# Patient Record
Sex: Male | Born: 1975 | Race: White | Hispanic: No | Marital: Married | Smoking: Current every day smoker
Health system: Southern US, Community
[De-identification: ages and names within clinical notes are randomized; demographics above are authoritative.]

## PROBLEM LIST (undated history)

## (undated) DIAGNOSIS — M549 Dorsalgia, unspecified: Secondary | ICD-10-CM

## (undated) DIAGNOSIS — G7100 Muscular dystrophy, unspecified: Secondary | ICD-10-CM

## (undated) DIAGNOSIS — M21379 Foot drop, unspecified foot: Secondary | ICD-10-CM

## (undated) DIAGNOSIS — G8929 Other chronic pain: Secondary | ICD-10-CM

## (undated) HISTORY — DX: Other chronic pain: G89.29

## (undated) HISTORY — DX: Dorsalgia, unspecified: M54.9

## (undated) HISTORY — DX: Muscular dystrophy, unspecified: G71.00

---

## 2007-10-15 ENCOUNTER — Emergency Department (HOSPITAL_COMMUNITY): Admission: EM | Admit: 2007-10-15 | Discharge: 2007-10-15 | Payer: Self-pay | Admitting: Emergency Medicine

## 2007-10-23 ENCOUNTER — Ambulatory Visit (HOSPITAL_COMMUNITY): Admission: RE | Admit: 2007-10-23 | Discharge: 2007-10-23 | Payer: Self-pay | Admitting: Emergency Medicine

## 2011-09-03 LAB — DIFFERENTIAL
Eosinophils Relative: 1
Lymphocytes Relative: 17
Neutro Abs: 9.8 — ABNORMAL HIGH
Neutrophils Relative %: 76

## 2011-09-03 LAB — CBC
MCHC: 33.9
MCV: 89.6
RBC: 5.24
WBC: 12.9 — ABNORMAL HIGH

## 2011-09-03 LAB — BASIC METABOLIC PANEL
BUN: 13
CO2: 27
Chloride: 102
GFR calc Af Amer: >60

## 2011-09-03 LAB — URINALYSIS, ROUTINE W REFLEX MICROSCOPIC
Glucose, UA: NEGATIVE
Nitrite: NEGATIVE
Urobilinogen, UA: 0.2

## 2011-09-03 LAB — CULTURE, BLOOD (ROUTINE X 2)

## 2012-04-16 ENCOUNTER — Other Ambulatory Visit: Payer: Self-pay | Admitting: Family Medicine

## 2012-04-16 DIAGNOSIS — G8929 Other chronic pain: Secondary | ICD-10-CM

## 2012-04-22 ENCOUNTER — Other Ambulatory Visit: Payer: Self-pay

## 2012-04-24 ENCOUNTER — Inpatient Hospital Stay: Admission: RE | Admit: 2012-04-24 | Payer: Self-pay | Source: Ambulatory Visit

## 2013-06-18 ENCOUNTER — Telehealth: Payer: Self-pay | Admitting: Family Medicine

## 2013-06-23 NOTE — Telephone Encounter (Signed)
APPT MADE

## 2013-07-09 ENCOUNTER — Encounter: Payer: Self-pay | Admitting: Family Medicine

## 2013-07-09 ENCOUNTER — Ambulatory Visit (INDEPENDENT_AMBULATORY_CARE_PROVIDER_SITE_OTHER): Payer: Medicare Other | Admitting: Family Medicine

## 2013-07-09 VITALS — BP 146/82 | HR 67 | Temp 97.8°F | Ht 63.5 in | Wt 122.2 lb

## 2013-07-09 DIAGNOSIS — G7109 Other specified muscular dystrophies: Secondary | ICD-10-CM

## 2013-07-09 DIAGNOSIS — G71039 Limb girdle muscular dystrophy, unspecified: Secondary | ICD-10-CM

## 2013-07-09 DIAGNOSIS — G8929 Other chronic pain: Secondary | ICD-10-CM

## 2013-07-09 DIAGNOSIS — M549 Dorsalgia, unspecified: Secondary | ICD-10-CM

## 2013-07-09 MED ORDER — KETOROLAC TROMETHAMINE 30 MG/ML IJ SOLN
60.0000 mg | Freq: Once | INTRAMUSCULAR | Status: AC
  Administered 2013-07-09: 60 mg via INTRAMUSCULAR

## 2013-07-09 MED ORDER — CELECOXIB 200 MG PO CAPS: 200.0000 mg | ORAL_CAPSULE | Freq: Two times a day (BID) | ORAL | Status: DC

## 2013-07-09 NOTE — Patient Instructions (Signed)
Ketorolac injection What is this medicine? KETOROLAC (kee toe ROLE ak) is a non-steroidal anti-inflammatory drug (NSAID). It is used to treat moderate to severe pain for up to 5 days. It is commonly used after surgery. This medicine should not be used for more than 5 days. This medicine may be used for other purposes; ask your health care provider or pharmacist if you have questions. What should I tell my health care provider before I take this medicine? They need to know if you have any of these conditions: -asthma, especially aspirin-sensitive asthma -bleeding problems -kidney disease -stomach bleed, ulcer, or other problem -taking aspirin, other NSAID, or probenecid -an unusual or allergic reaction to ketorolac, tromethamine, aspirin, other NSAIDs, other medicines, foods, dyes or preservatives -pregnant or trying to get pregnant -breast-feeding How should I use this medicine? This medicine is for injection into a muscle or into a vein. It is given by a health care professional in a hospital or clinic setting. Talk to your pediatrician regarding the use of this medicine in children. While this drug may be prescribed for children as young as 2 years old for selected conditions, precautions do apply. Patients over 65 years old may have a stronger reaction and need a smaller dose. Overdosage: If you think you have taken too much of this medicine contact a poison control center or emergency room at once. NOTE: This medicine is only for you. Do not share this medicine with others. What if I miss a dose? This does not apply. What may interact with this medicine? Do not take this medicine with any of the following medications: -aspirin and aspirin-like medicines -cidofovir -methotrexate -NSAIDs, medicines for pain and inflammation, like ibuprofen or naproxen -pentoxifylline -probenecid This medicine may also interact with the following  medications: -alcohol -alendronate -alprazolam -carbamazepine -diuretics -flavocoxid -fluoxetine -ginkgo -lithium -medicines for blood pressure like enalapril -medicines that affect platelets like pentoxifylline -medicines that treat or prevent blood clots like heparin, warfarin -muscle relaxants -pemetrexed -phenytoin -thiothixene This list may not describe all possible interactions. Give your health care provider a list of all the medicines, herbs, non-prescription drugs, or dietary supplements you use. Also tell them if you smoke, drink alcohol, or use illegal drugs. Some items may interact with your medicine. What should I watch for while using this medicine? Tell your doctor or healthcare professional if your symptoms do not start to get better or if they get worse. This medicine does not prevent heart attack or stroke. In fact, this medicine may increase the chance of a heart attack or stroke. The chance may increase with longer use of this medicine and in people who have heart disease. If you take aspirin to prevent heart attack or stroke, talk with your doctor or health care professional. Do not take medicines such as ibuprofen and naproxen with this medicine. Side effects such as stomach upset, nausea, or ulcers may be more likely to occur. Many medicines available without a prescription should not be taken with this medicine. This medicine can cause ulcers and bleeding in the stomach and intestines at any time during treatment. Do not smoke cigarettes or drink alcohol. These increase irritation to your stomach and can make it more susceptible to damage from this medicine. Ulcers and bleeding can happen without warning symptoms and can cause death. This medicine can cause you to bleed more easily. Try to avoid damage to your teeth and gums when you brush or floss your teeth. What side effects may I notice from receiving   this medicine? Side effects that you should report to your  doctor or health care professional as soon as possible: -allergic reactions like skin rash, itching or hives, swelling of the face, lips, or tongue -black or tarry stools -breathing problems -changes in vision -chest pain -high blood pressure -nausea, vomiting -redness, blistering, peeling or loosening of the skin, including inside the mouth -severe abdominal pain -slurred speech or weakness on one side of the body -trouble passing urine or change in the amount of urine -unexplained weight gain or swelling -unusual bleeding or bruising -unusually weak or tired -yellowing of eyes or skin Side effects that usually do not require medical attention (report to your doctor or health care professional if they continue or are bothersome): -diarrhea -dizziness -headache -heartburn This list may not describe all possible side effects. Call your doctor for medical advice about side effects. You may report side effects to FDA at 1-800-FDA-1088. Where should I keep my medicine? This drug is given in a hospital or clinic and will not be stored at home. NOTE: This sheet is a summary. It may not cover all possible information. If you have questions about this medicine, talk to your doctor, pharmacist, or health care provider.  2013, Elsevier/Gold Standard. (03/31/2008 5:24:50 PM)  

## 2013-07-09 NOTE — Progress Notes (Signed)
Patient ID: Jacob Shepard, male   DOB: November 15, 1976, 37 y.o.   MRN: [id number] SUBJECTIVE: CC: Chief Complaint  Patient presents with  . Medication Problem    discuss pain med management . states he is pt at [facility] and states was seen at [facility] and was on oxycontontin and can not tolerate it.  was seen at [facility] last week . feels like he needs pain med but nothing to strong .    HPI: Came with wife. Attends [employer] because the 5 years has had symptoms that he was finally diagnosed with Muscular Dystrophy. Needs a pain management doctor because his walking produces  Back starins and pains.  Past Medical History  Diagnosis Date  . Muscular dystrophy   . Chronic back pain    No past surgical history on file. History   Social History  . Marital Status: Married    Spouse Name: N/A    Number of Children: N/A  . Years of Education: N/A   Occupational History  . Not on file.   Social History Main Topics  . Smoking status: Current Every Day Smoker -- 0.25 packs/day    Types: Cigarettes  . Smokeless tobacco: Not on file  . Alcohol Use: Not on file  . Drug Use: Not on file  . Sexual Activity: Not on file   Other Topics Concern  . Not on file   Social History Narrative  . No narrative on file   No family history on file. No current outpatient prescriptions on file prior to visit.   No current facility-administered medications on file prior to visit.   No Known Allergies  There is no immunization history on file for this patient. Prior to Admission medications   Medication Sig Start Date End Date Taking? Authorizing Provider  celecoxib (CELEBREX) 200 MG capsule Take 1 capsule (200 mg total) by mouth 2 (two) times daily. 07/09/13   Jacob Ladd, MD     ROS: As above in the HPI. All other systems are stable or negative.  OBJECTIVE: APPEARANCE:  Patient in no acute distress.The patient appeared well nourished and normally developed.  Acyanotic. Waist: VITAL SIGNS:  SKIN: warm and  Dry without overt rashes, tattoos and scars  HEAD and Neck: without JVD, Head and scalp: normal Eyes:No scleral icterus. Fundi normal, eye movements normal. Ears: Auricle normal, canal normal, Tympanic membranes normal, insufflation normal. Nose: normal Throat: normal Neck & thyroid: normal  CHEST & LUNGS: Chest wall: normal Lungs: Clear  CVS: Reveals the PMI to be normally located. Regular rhythm, First and Second Heart sounds are normal,  absence of murmurs, rubs or gallops. Peripheral vasculature: Radial pulses: normal Dorsal pedis pulses: normal Posterior pulses: normal  ABDOMEN:  Appearance: normal Benign, no organomegaly, no masses, no Abdominal Aortic enlargement. No Guarding , no rebound. No Bruits. Bowel sounds: normal  RECTAL: N/A GU: N/A  EXTREMETIES: nonedematous. Both Femoral and Pedal pulses are normal.  NEUROLOGIC: oriented to time,place and person Walks with a waddling wide stepping gait with foot drops .    ASSESSMENT: Muscular dystrophy, limb girdle - Plan: Ambulatory referral to Pain Clinic, EKG 12-Lead, ketorolac (TORADOL) 30 MG/ML injection 60 mg  Chronic back pain - Plan: Ambulatory referral to Pain Clinic, EKG 12-Lead, ketorolac (TORADOL) 30 MG/ML injection 60 mg  PLAN:  Orders Placed This Encounter  Procedures  . Ambulatory referral to Pain Clinic    Referral Priority:  Routine    Referral Type:  Consultation  Referral Reason:  Specialty Services Required    Requested Specialty:  Pain Medicine    Number of Visits Requested:  1  . EKG 12-Lead    Meds ordered this encounter  Medications  . celecoxib (CELEBREX) 200 MG capsule    Sig: Take 1 capsule (200 mg total) by mouth 2 (two) times daily.    Dispense:  30 capsule    Refill:  1  . ketorolac (TORADOL) 30 MG/ML injection 60 mg    Sig:     Results for orders placed during the hospital encounter of 10/15/07  CULTURE, BLOOD  (ROUTINE X 2)      Result Value Range   Specimen Description BLOOD LEFT ARM     Special Requests BOTTLES DRAWN AEROBIC AND ANAEROBIC 10CC EACH     Culture NO GROWTH 5 DAYS     Report Status 10/21/2007 FINAL    CULTURE, BLOOD (ROUTINE X 2)      Result Value Range   Specimen Description BLOOD RIGHT ARM     Special Requests BOTTLES DRAWN AEROBIC AND ANAEROBIC 10CC     Culture NO GROWTH 5 DAYS     Report Status 10/21/2007 FINAL    URINALYSIS, ROUTINE W REFLEX MICROSCOPIC      Result Value Range   Color, Urine YELLOW     APPearance CLEAR     Specific Gravity, Urine 1.007     pH 6.5     Glucose, UA NEGATIVE     Hgb urine dipstick NEGATIVE     Bilirubin Urine NEGATIVE     Ketones, ur NEGATIVE     Protein, ur NEGATIVE     Urobilinogen, UA 0.2     Nitrite NEGATIVE     Leukocytes, UA       Value: NEGATIVE MICROSCOPIC NOT DONE ON URINES WITH NEGATIVE PROTEIN, BLOOD, LEUKOCYTES, NITRITE, OR GLUCOSE <1000 mg/dL.  BASIC METABOLIC PANEL      Result Value Range   Sodium 139     Potassium 3.3 (*)    Chloride 102     CO2 27     Glucose, Bld 83     BUN 13     Creatinine, Ser 0.86     Calcium 10.0     GFR calc non Af Amer >60     GFR calc Af Amer       Value: >60            The eGFR has been calculated     using the MDRD equation.     This calculation has not been     validated in all clinical  CBC      Result Value Range   WBC 12.9 (*)    RBC 5.24     Hemoglobin 15.9     HCT 46.9     MCV 89.6     MCHC 33.9     RDW 13.5     Platelets 355    DIFFERENTIAL      Result Value Range   Neutrophils Relative % 76     Neutro Abs 9.8 (*)    Lymphocytes Relative 17     Lymphs Abs 2.1     Monocytes Relative 7     Monocytes Absolute 0.8     Eosinophils Relative 1     Eosinophils Absolute 0.1 (*)    Basophils Relative 0     Basophils Absolute 0.0    CK      Result Value Range  Total CK 638 (*)    EKG was okay.  Return if symptoms worsen or fail to improve, for Recheck medical  problems.  Rey Fors P. Modesto Charon, M.D.

## 2013-07-09 NOTE — Progress Notes (Signed)
Tolerated toradol injection well without difficulty  

## 2013-07-15 ENCOUNTER — Ambulatory Visit: Payer: Self-pay | Admitting: Family Medicine

## 2013-08-02 ENCOUNTER — Telehealth: Payer: Self-pay | Admitting: *Deleted

## 2013-08-02 NOTE — Telephone Encounter (Signed)
Ins co denied celebrex until two generic nsaids have been tried for at least 14 days each and failed.  She said any generic available nsaid would be covered. Can you take care of this?  Thanks in advance.

## 2013-08-04 ENCOUNTER — Other Ambulatory Visit: Payer: Self-pay | Admitting: Family Medicine

## 2013-08-04 MED ORDER — MELOXICAM 15 MG PO TABS: 15.0000 mg | ORAL_TABLET | Freq: Every day | ORAL | Status: DC

## 2013-08-04 NOTE — Telephone Encounter (Signed)
Prescription ordered in [facility] for meloxicam since celebrex was not.

## 2014-03-24 LAB — PULMONARY FUNCTION TEST

## 2014-05-01 ENCOUNTER — Encounter (HOSPITAL_COMMUNITY): Payer: Self-pay | Admitting: Emergency Medicine

## 2014-05-01 ENCOUNTER — Emergency Department (HOSPITAL_COMMUNITY)
Admission: EM | Admit: 2014-05-01 | Discharge: 2014-05-01 | Disposition: A | Discharge status: Home / Self Care | Payer: PRIVATE HEALTH INSURANCE | Attending: Emergency Medicine | Admitting: Emergency Medicine

## 2014-05-01 ENCOUNTER — Emergency Department (HOSPITAL_COMMUNITY): Payer: PRIVATE HEALTH INSURANCE

## 2014-05-01 DIAGNOSIS — Z8739 Personal history of other diseases of the musculoskeletal system and connective tissue: Secondary | ICD-10-CM | POA: Diagnosis not present

## 2014-05-01 DIAGNOSIS — R296 Repeated falls: Secondary | ICD-10-CM | POA: Insufficient documentation

## 2014-05-01 DIAGNOSIS — F17200 Nicotine dependence, unspecified, uncomplicated: Secondary | ICD-10-CM | POA: Diagnosis not present

## 2014-05-01 DIAGNOSIS — Y9389 Activity, other specified: Secondary | ICD-10-CM | POA: Diagnosis not present

## 2014-05-01 DIAGNOSIS — S82002A Unspecified fracture of left patella, initial encounter for closed fracture: Secondary | ICD-10-CM

## 2014-05-01 DIAGNOSIS — Y929 Unspecified place or not applicable: Secondary | ICD-10-CM | POA: Diagnosis not present

## 2014-05-01 DIAGNOSIS — S99919A Unspecified injury of unspecified ankle, initial encounter: Secondary | ICD-10-CM | POA: Diagnosis present

## 2014-05-01 DIAGNOSIS — G8929 Other chronic pain: Secondary | ICD-10-CM | POA: Diagnosis not present

## 2014-05-01 DIAGNOSIS — S8990XA Unspecified injury of unspecified lower leg, initial encounter: Secondary | ICD-10-CM | POA: Diagnosis present

## 2014-05-01 DIAGNOSIS — S82009A Unspecified fracture of unspecified patella, initial encounter for closed fracture: Secondary | ICD-10-CM | POA: Insufficient documentation

## 2014-05-01 HISTORY — DX: Foot drop, unspecified foot: M21.379

## 2014-05-01 MED ORDER — ONDANSETRON 4 MG PO TBDP
4.0000 mg | ORAL_TABLET | Freq: Once | ORAL | Status: AC
  Administered 2014-05-01: 4 mg via ORAL
  Filled 2014-05-01: qty 1

## 2014-05-01 MED ORDER — MORPHINE SULFATE 4 MG/ML IJ SOLN
INTRAMUSCULAR | Status: AC
  Filled 2014-05-01: qty 1

## 2014-05-01 MED ORDER — NAPROXEN 375 MG PO TABS: 375.0000 mg | ORAL_TABLET | Freq: Two times a day (BID) | ORAL | Status: DC

## 2014-05-01 MED ORDER — MORPHINE SULFATE 4 MG/ML IJ SOLN
4.0000 mg | Freq: Once | INTRAMUSCULAR | Status: AC
  Administered 2014-05-01: 4 mg via INTRAMUSCULAR
  Filled 2014-05-01: qty 1

## 2014-05-01 MED ORDER — OXYCODONE-ACETAMINOPHEN 5-325 MG PO TABS
1.0000 | ORAL_TABLET | Freq: Once | ORAL | Status: AC
Start: 2014-05-01 — End: 2014-05-01
  Administered 2014-05-01: 1 via ORAL
  Filled 2014-05-01: qty 1

## 2014-05-01 MED ORDER — MORPHINE SULFATE 4 MG/ML IJ SOLN
4.0000 mg | Freq: Once | INTRAMUSCULAR | Status: AC
  Administered 2014-05-01: 4 mg via INTRAMUSCULAR

## 2014-05-01 MED ORDER — OXYCODONE-ACETAMINOPHEN 5-325 MG PO TABS: ORAL_TABLET | ORAL | Status: DC

## 2014-05-01 NOTE — ED Notes (Signed)
Tripped over dog today.  C/o pain to left knee, unable to bend left knee.  Obvious swelling noted.

## 2014-05-01 NOTE — ED Notes (Signed)
Ice applied to left knee, pt and family updated on plan of care,

## 2014-05-01 NOTE — ED Provider Notes (Signed)
CSN: [id number]     Arrival date & time 05/01/14  1259 History  This chart was scribed for Ward Givens, MD by Chestine Spore, ED Scribe. The patient was seen in room APA07/APA07 at 2:19 PM.   Chief Complaint  Patient presents with  . Knee Injury   The history is provided by the patient. No language interpreter was used.   HPI Comments: Jacob Shepard is a 38 y.o. male with a h/o of muscle dystrophy who presents to the Emergency Department complaining of left knee injury that occurred at 8:20 AM today. Pt states that he started to fall but he had a cat food bowl in his hands and landed on his left knee and he couldn't catch himself. Pt states that when he fell, he fell directly onto his left knee and he felt pain in his knee. Pt states that when he first got up he didn't notice any changes in his knee and was able to walk about 15 feet without difficulty. Pt states that he is currently unable to walk without pain. He has been using ice and heat on his knee since he fell. Pt states that he is unable to bend or straighten his knee which prevents him from walking.  He also reports associated increased  swelling to the knee.  Pt iced and used a heat compress on the knee cap while sitting without any relief.  Pt states that he falls 3-4 times a week. Pt states that he takes Prilosec for his GERD.    [facility]   Past Medical History  Diagnosis Date  . Muscular dystrophy   . Chronic back pain   . Foot drop    History reviewed. No pertinent past surgical history. No family history on file. History  Substance Use Topics  . Smoking status: Current Every Day Smoker -- 0.25 packs/day    Types: Cigarettes  . Smokeless tobacco: Not on file  . Alcohol Use: Yes     Comment: occ   Lives at home Lives with spouse   Review of Systems  Musculoskeletal: Positive for arthralgias (left knee) and joint swelling (left knee).       Swelling over the left knee.  Skin:       Knot on right  lower leg.  All other systems reviewed and are negative.    Allergies  Review of patient's allergies indicates no known allergies.  Home Medications   Prior to Admission medications   Medication Sig Start Date End Date Taking? Authorizing Provider  prilosec   BP 178/119  Pulse 89  Temp(Src) 99.2 F (37.3 C) (Oral)  Resp 20  SpO2 100%  Vital signs normal except for hypertension consistent with his pain   Physical Exam  Nursing note and vitals reviewed. Constitutional: He is oriented to person, place, and time. He appears well-developed and well-nourished.  Non-toxic appearance. He does not appear ill. No distress.  HENT:  Head: Normocephalic and atraumatic.  Right Ear: External ear normal.  Left Ear: External ear normal.  Nose: Nose normal. No mucosal edema or rhinorrhea.  Mouth/Throat: Oropharynx is clear and moist and mucous membranes are normal. No dental abscesses or uvula swelling.  Eyes: Conjunctivae and EOM are normal. Pupils are equal, round, and reactive to light.  Neck: Normal range of motion and full passive range of motion without pain. Neck supple.  Cardiovascular: Normal rate, regular rhythm and normal heart sounds.  Exam reveals no gallop and no friction rub.  No murmur heard. Pulmonary/Chest: Effort normal and breath sounds normal. No respiratory distress. He has no wheezes. He has no rhonchi. He has no rales. He exhibits no tenderness and no crepitus.  Abdominal: Soft. Normal appearance and bowel sounds are normal. He exhibits no distension. There is no tenderness. There is no rebound and no guarding.  Musculoskeletal: Normal range of motion. He exhibits tenderness. He exhibits no edema.  Diffused swelling and moderate effusion is present on the left knee. Unable to SLR. Distally non tender ankle and foot. Good pulses.  Neurological: He is alert and oriented to person, place, and time. He has normal strength. No cranial nerve deficit.  Skin: Skin is warm,  dry and intact. No rash noted. No erythema. No pallor.  Small subcutaneous nodule on the distal right leg medially, superior to the right medial malleolus, non-tender.   Psychiatric: He has a normal mood and affect. His speech is normal and behavior is normal. His mood appears not anxious.    ED Course  Procedures (including critical care time)  Medications  morphine 4 MG/ML injection 4 mg (4 mg Intramuscular Given 05/01/14 1440)  ondansetron (ZOFRAN-ODT) disintegrating tablet 4 mg (4 mg Oral Given 05/01/14 1442)  morphine 4 MG/ML injection 4 mg (4 mg Intramuscular Given 05/01/14 1514)  oxyCODONE-acetaminophen (PERCOCET/ROXICET) 5-325 MG per tablet 1 tablet (1 tablet Oral Given 05/01/14 1551)    COORDINATION OF CARE: 2:28 PM-Discussed treatment plan of a CT scan, medication, and knee immobilizer with pt at bedside and pt agreed to plan.  Patient given option to take IM pain medicine or oral medication.  He opted for IM pain medicine.  Pt shown his CT results.  Discussed need to follow up with an orthopedist.  Pt placed in a posterior long leg splint. His wife states he has crutches at home.    Ct Knee Left Wo Contrast  05/01/2014   CLINICAL DATA:  Status post fall.  Large joint effusion.  EXAM: CT OF THE LEFT KNEE KNEE WITHOUT CONTRAST  TECHNIQUE: Multidetector CT imaging of the LEFT KNEE knee was performed according to the standard protocol. Multiplanar CT image reconstructions were also generated.  COMPARISON:  Plain films left knee earlier this same day and 12/18/2013.  FINDINGS: The patient has a nondisplaced longitudinal fracture through the medial patellar facet. No other fracture is identified. There is a large lipohemarthrosis. As visualized by CT scan, the menisci and cruciate and collateral ligaments appear intact.  IMPRESSION: Large lipohemarthrosis secondary to a nondisplaced longitudinal fracture of the medial patellar facet.   Electronically Signed   By: Drusilla Kanner M.D.   On:  05/01/2014 15:24    Dg Knee Complete 4 Views Left  05/01/2014   CLINICAL DATA:  Pain and swelling post trauma  EXAM: LEFT KNEE - COMPLETE 4+ VIEW  COMPARISON:  December 18, 2013  FINDINGS: Frontal, lateral, and bilateral oblique views were obtained. There is a large joint effusion. There is soft tissue swelling in this area is well. There is no acute fracture or dislocation. Joint spaces appear intact. No erosive change.  IMPRESSION: Large joint effusion with soft tissue swelling. No apparent fracture.   Electronically Signed   By: Bretta Bang M.D.   On: 05/01/2014 14:07      MDM   Final diagnoses:  Left patella fracture    Discharge Medication List as of 05/01/2014  3:55 PM    START taking these medications   Details  naproxen (NAPROSYN) 375 MG tablet Take 1  tablet (375 mg total) by mouth 2 (two) times daily with a meal., Starting 05/01/2014, Until Discontinued, Print    oxyCODONE-acetaminophen (PERCOCET/ROXICET) 5-325 MG per tablet Take 1 or 2 po Q 6hrs for pain, Print        Plan discharge  Devoria AlbeIva Minette Manders, MD, FACEP    I personally performed the services described in this documentation, which was scribed in my presence. The recorded information has been reviewed and considered.  Devoria AlbeIva Ripley Bogosian, MD, Armando GangFACEP    Ward GivensIva L Tyechia Allmendinger, MD 05/01/14 2115

## 2014-05-01 NOTE — ED Notes (Signed)
Dr Knapp at bedside,  

## 2014-05-01 NOTE — Discharge Instructions (Signed)
You have a fracture of your knee cap of your left knee. You need to see an orthopedist this week to decide if you need surgery or not. You might consider seeing an orthopedist at [facility] so they can coordinate your care with your other doctors there. Elevate your leg. Continue the ice packs, stop the heat for now. Use your crutches and don't put weight on the splint.  Take the oxycodone for pain. You can also take the naproxen with the oxycodone for pain.

## 2014-05-01 NOTE — ED Notes (Signed)
Pt called out to nursing desk, advised that the pain medication has not helped, Dr Lynelle Doctor notified, additional orders given

## 2014-05-01 NOTE — ED Notes (Addendum)
Pt states that he has Muscular dystrophy and has weakness in extremities, was talking cat food to the cats today when his legs became weak, causing him to fall, denies tripping, pt became very agitated when I asked him about tripping, pt c/o pain to left knee,  cms intact distal, denies any other injury, states that he has been unable to bear weight on left knee since falling this am, comfort measure provided,

## 2014-05-01 NOTE — ED Notes (Signed)
Pt states that he has crutches as home and does not need a pair from er, Pt instructed not to put any weight on splint, pt and family member at bedside expressed understanding,

## 2015-07-27 ENCOUNTER — Encounter (HOSPITAL_COMMUNITY): Payer: Self-pay | Admitting: *Deleted

## 2015-07-27 ENCOUNTER — Emergency Department (HOSPITAL_COMMUNITY): Payer: PRIVATE HEALTH INSURANCE

## 2015-07-27 ENCOUNTER — Emergency Department (HOSPITAL_COMMUNITY)
Admission: EM | Admit: 2015-07-27 | Discharge: 2015-07-27 | Disposition: A | Discharge status: Home / Self Care | Payer: PRIVATE HEALTH INSURANCE | Attending: Emergency Medicine | Admitting: Emergency Medicine

## 2015-07-27 DIAGNOSIS — Z720 Tobacco use: Secondary | ICD-10-CM | POA: Diagnosis not present

## 2015-07-27 DIAGNOSIS — S299XXA Unspecified injury of thorax, initial encounter: Secondary | ICD-10-CM | POA: Diagnosis not present

## 2015-07-27 DIAGNOSIS — S0990XA Unspecified injury of head, initial encounter: Secondary | ICD-10-CM | POA: Diagnosis present

## 2015-07-27 DIAGNOSIS — Y9389 Activity, other specified: Secondary | ICD-10-CM | POA: Diagnosis not present

## 2015-07-27 DIAGNOSIS — Y92002 Bathroom of unspecified non-institutional (private) residence single-family (private) house as the place of occurrence of the external cause: Secondary | ICD-10-CM | POA: Insufficient documentation

## 2015-07-27 DIAGNOSIS — S0083XA Contusion of other part of head, initial encounter: Secondary | ICD-10-CM | POA: Diagnosis not present

## 2015-07-27 DIAGNOSIS — Y998 Other external cause status: Secondary | ICD-10-CM | POA: Diagnosis not present

## 2015-07-27 DIAGNOSIS — G8929 Other chronic pain: Secondary | ICD-10-CM | POA: Insufficient documentation

## 2015-07-27 DIAGNOSIS — Z8739 Personal history of other diseases of the musculoskeletal system and connective tissue: Secondary | ICD-10-CM | POA: Insufficient documentation

## 2015-07-27 DIAGNOSIS — W010XXA Fall on same level from slipping, tripping and stumbling without subsequent striking against object, initial encounter: Secondary | ICD-10-CM | POA: Diagnosis not present

## 2015-07-27 DIAGNOSIS — T07XXXA Unspecified multiple injuries, initial encounter: Secondary | ICD-10-CM

## 2015-07-27 MED ORDER — HYDROMORPHONE HCL 2 MG/ML IJ SOLN
2.0000 mg | Freq: Once | INTRAMUSCULAR | Status: AC
  Administered 2015-07-27: 2 mg via INTRAMUSCULAR
  Filled 2015-07-27: qty 1

## 2015-07-27 MED ORDER — OXYCODONE-ACETAMINOPHEN 5-325 MG PO TABS
1.0000 | ORAL_TABLET | ORAL | Status: AC | PRN
Start: 2015-07-27 — End: ?

## 2015-07-27 MED ORDER — ONDANSETRON 8 MG PO TBDP
8.0000 mg | ORAL_TABLET | Freq: Once | ORAL | Status: AC
  Administered 2015-07-27: 8 mg via ORAL
  Filled 2015-07-27: qty 1

## 2015-07-27 NOTE — Discharge Instructions (Signed)
Contusion °A contusion is a deep bruise. Contusions are the result of an injury that caused bleeding under the skin. The contusion may turn blue, purple, or yellow. Minor injuries will give you a painless contusion, but more severe contusions may stay painful and swollen for a few weeks.  °CAUSES  °A contusion is usually caused by a blow, trauma, or direct force to an area of the body. °SYMPTOMS  °· Swelling and redness of the injured area. °· Bruising of the injured area. °· Tenderness and soreness of the injured area. °· Pain. °DIAGNOSIS  °The diagnosis can be made by taking a history and physical exam. An X-ray, CT scan, or MRI may be needed to determine if there were any associated injuries, such as fractures. °TREATMENT  °Specific treatment will depend on what area of the body was injured. In general, the best treatment for a contusion is resting, icing, elevating, and applying cold compresses to the injured area. Over-the-counter medicines may also be recommended for pain control. Ask your caregiver what the best treatment is for your contusion. °HOME CARE INSTRUCTIONS  °· Put ice on the injured area. °¨ Put ice in a plastic bag. °¨ Place a towel between your skin and the bag. °¨ Leave the ice on for 15-20 minutes, 3-4 times a day, or as directed by your health care provider. °· Only take over-the-counter or prescription medicines for pain, discomfort, or fever as directed by your caregiver. Your caregiver may recommend avoiding anti-inflammatory medicines (aspirin, ibuprofen, and naproxen) for 48 hours because these medicines may increase bruising. °· Rest the injured area. °· If possible, elevate the injured area to reduce swelling. °SEEK IMMEDIATE MEDICAL CARE IF:  °· You have increased bruising or swelling. °· You have pain that is getting worse. °· Your swelling or pain is not relieved with medicines. °MAKE SURE YOU:  °· Understand these instructions. °· Will watch your condition. °· Will get help right  away if you are not doing well or get worse. °Document Released: 08/21/2005 Document Revised: 11/16/2013 Document Reviewed: 09/16/2011 °Exit[employer]® Patient Information ©2015 [employer]. This information is not intended to replace advice given to you by your health care provider. Make sure you discuss any questions you have with your health care provider. ° °

## 2015-07-27 NOTE — ED Notes (Signed)
Pt has hx of MD and falls a lot, per wife. Pt fell an hour PTA and felt a pop. Pain to right hip, leg and ribs.

## 2015-07-27 NOTE — ED Provider Notes (Signed)
CSN: [id number]     Arrival date & time 07/27/15  1315 History   First MD Initiated Contact with Patient 07/27/15 1325     Chief Complaint  Patient presents with  . Fall     (Consider location/radiation/quality/duration/timing/severity/associated sxs/prior Treatment) HPI   Jacob Shepard is a 39 y.o. male who presents for evaluation of injuries from fall. He was standing up from the bathroom, tying his shorts, then turned, and slipped on a towel. This caused him to fall, injuring the right chest head and right lower back. 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 Since the fall. He has noticed some tingling in his left leg. He was able to get up and walk with help, after his wife arrived. There is no suggestion for loss of consciousness. He apparently was on the floor for about 30 minutes before his wife arrived. He denies shortness of breath, nausea, vomiting, blurred vision or pain in his arms. He has mild headache and neck pain. There are no other known modifying factors.   Past Medical History  Diagnosis Date  . Muscular dystrophy   . Chronic back pain   . Foot drop    History reviewed. No pertinent past surgical history. No family history on file. Social History  Substance Use Topics  . Smoking status: Current Every Day Smoker -- 0.25 packs/day    Types: Cigarettes  . Smokeless tobacco: None  . Alcohol Use: Yes     Comment: occ    Review of Systems  All other systems reviewed and are negative.     Allergies  Review of patient's allergies indicates no known allergies.  Home Medications   Prior to Admission medications   Medication Sig Start Date End Date Taking? Authorizing Provider  DiphenhydrAMINE HCl, Sleep, (ZZZQUIL) 25 MG CAPS Take 2-4 tablets by mouth at bedtime.   Yes Historical Provider, MD  oxyCODONE-acetaminophen (PERCOCET) 5-325 MG per tablet Take 1 tablet by mouth every 4 (four) hours  as needed for severe pain. 07/27/15   Jacob Bale, MD   BP 138/90 mmHg  Pulse 73  Temp(Src) 98.2 F (36.8 C) (Oral)  Resp 16  Ht  (1.549 m)  Wt 121 lb (54.885 kg)  BMI 22.87 kg/m2  SpO2 100% Physical Exam  Constitutional: He is oriented to person, place, and time. He appears well-developed and well-nourished.  HENT:  Head: Normocephalic and atraumatic.  Right Ear: External ear normal.  Left Ear: External ear normal.  3 small contusions right forehead. No associated abrasion or laceration.  Eyes: Conjunctivae and EOM are normal. Pupils are equal, round, and reactive to light.  Neck: Normal range of motion and phonation normal. Neck supple.  Cardiovascular: Normal rate, regular rhythm and normal heart sounds.   Pulmonary/Chest: Effort normal and breath sounds normal. He exhibits no bony tenderness.  Abdominal: Soft. There is no tenderness.  Musculoskeletal: Normal range of motion.  Mild right lower posterior chest wall tenderness without crepitation. Moderate right posterior pelvic tenderness without crepitation or deformity. No midline tenderness of the thoracic or lumbar spines. Neck is without local tenderness or deformity.  Neurological: He is alert and oriented to person, place, and time. No cranial nerve deficit or sensory deficit. He exhibits normal muscle tone. Coordination normal.  Skin: Skin is warm, dry and intact.  Psychiatric: He has a normal mood and affect. His behavior is normal. Judgment and thought content normal.  Nursing note and vitals reviewed.  ED Course  Procedures (including critical care time)  Medications  HYDROmorphone (DILAUDID) injection 2 mg (2 mg Intramuscular Given 07/27/15 1348)  ondansetron (ZOFRAN-ODT) disintegrating tablet 8 mg (8 mg Oral Given 07/27/15 1348)    Patient Vitals for the past 24 hrs:  BP Temp Temp src Pulse Resp SpO2 Height Weight  07/27/15 1532 138/90 mmHg - - 73 16 100 % - -  07/27/15 1319 107/89 mmHg 98.2 F (36.8 C)  Oral 77 20 100 % 5\' 1"  (1.549 m) 121 lb (54.885 kg)    3:44 PM Reevaluation with update and discussion. After initial assessment and treatment, an updated evaluation reveals he states he is feeling better after the pain medication. He has no additional complaints. Findings discussed with patient and wife, all questions were answered. Jacob Shepard L    Labs Review Labs Reviewed - No data to display  Imaging Review Dg Ribs Unilateral W/chest Right  07/27/2015   CLINICAL DATA:  Status post slip and fall in the bathroom today with a blow to the right side of the chest. Pain. Initial encounter.  EXAM: RIGHT RIBS AND CHEST - 3+ VIEW  COMPARISON:  None.  FINDINGS: No rib fracture is identified. Surrounding osseous structures also appear normal. The right lung is well expanded and clear.  IMPRESSION: Negative exam.   Electronically Signed   By: Drusilla Kanner M.D.   On: 07/27/2015 14:27   Dg Pelvis 1-2 Views  07/27/2015   CLINICAL DATA:  Right pelvic popping sensation after falling in the bathroom striking the right side on a door frame.  EXAM: PELVIS - 1-2 VIEW  COMPARISON:  None.  FINDINGS: There is no evidence of pelvic fracture or diastasis. No pelvic bone lesions are seen.  IMPRESSION: Negative.   Electronically Signed   By: Gaylyn Rong M.D.   On: 07/27/2015 14:26   Ct Head Wo Contrast  07/27/2015   CLINICAL DATA:  Current history of muscular dystrophy resulting in multiple falls. Patient fell earlier today approximately 1 hour prior to arrival in the emergency department and heard an audible pop.  EXAM: CT HEAD WITHOUT CONTRAST  CT CERVICAL SPINE WITHOUT CONTRAST  TECHNIQUE: Multidetector CT imaging of the head and cervical spine was performed following the standard protocol without intravenous contrast. Multiplanar CT image reconstructions of the cervical spine were also generated.  COMPARISON:  None.  FINDINGS: CT HEAD FINDINGS  Ventricular system normal in size and appearance for age. No  mass lesion. No midline shift. No acute hemorrhage or hematoma. No extra-axial fluid collections. No evidence of acute infarction. No focal brain parenchymal abnormalities.  No skull fractures or other focal osseous abnormalities involving the skull. Visualized paranasal sinuses, bilateral mastoid air cells, and bilateral middle ear cavities well-aerated.  CT CERVICAL SPINE FINDINGS  Patient motion blurred images of the upper cervical spine but these were repeated and a diagnostic study was obtained.  No fractures identified involving the cervical spine. Sagittal reconstructed images demonstrate anatomic posterior alignment. No evidence of spinal stenosis. Neural foramina widely patent throughout. Facet joints intact throughout. Disc spaces well preserved, and no significant disc protrusion identified on the soft tissue windows. Coronal reformatted images demonstrate an intact craniocervical junction, intact dens and intact lateral masses throughout.  IMPRESSION: 1. Normal unenhanced head CT. 2. Normal CT of the cervical spine.   Electronically Signed   By: Hulan Saas M.D.   On: 07/27/2015 15:29   Ct Cervical Spine Wo Contrast  07/27/2015   CLINICAL DATA:  Current history  of muscular dystrophy resulting in multiple falls. Patient fell earlier today approximately 1 hour prior to arrival in the emergency department and heard an audible pop.  EXAM: CT HEAD WITHOUT CONTRAST  CT CERVICAL SPINE WITHOUT CONTRAST  TECHNIQUE: Multidetector CT imaging of the head and cervical spine was performed following the standard protocol without intravenous contrast. Multiplanar CT image reconstructions of the cervical spine were also generated.  COMPARISON:  None.  FINDINGS: CT HEAD FINDINGS  Ventricular system normal in size and appearance for age. No mass lesion. No midline shift. No acute hemorrhage or hematoma. No extra-axial fluid collections. No evidence of acute infarction. No focal brain parenchymal abnormalities.  No  skull fractures or other focal osseous abnormalities involving the skull. Visualized paranasal sinuses, bilateral mastoid air cells, and bilateral middle ear cavities well-aerated.  CT CERVICAL SPINE FINDINGS  Patient motion blurred images of the upper cervical spine but these were repeated and a diagnostic study was obtained.  No fractures identified involving the cervical spine. Sagittal reconstructed images demonstrate anatomic posterior alignment. No evidence of spinal stenosis. Neural foramina widely patent throughout. Facet joints intact throughout. Disc spaces well preserved, and no significant disc protrusion identified on the soft tissue windows. Coronal reformatted images demonstrate an intact craniocervical junction, intact dens and intact lateral masses throughout.  IMPRESSION: 1. Normal unenhanced head CT. 2. Normal CT of the cervical spine.   Electronically Signed   By: Hulan Saas M.D.   On: 07/27/2015 15:29   I have personally reviewed and evaluated these images and lab results as part of my medical decision-making.   EKG Interpretation None      MDM   Final diagnoses:  Contusion, multiple sites    Fall related to weakness and balance problems associated with chronic disease. No evidence for fracture or serious injury.  Nursing Notes Reviewed/ Care Coordinated Applicable Imaging Reviewed Interpretation of Laboratory Data incorporated into ED treatment  The patient appears reasonably screened and/or stabilized for discharge and I doubt any other medical condition or other [facility] requiring further screening, evaluation, or treatment in the ED at this time prior to discharge.  Plan: Home Medications- Percocet; Home Treatments- rest; return here if the recommended treatment, does not improve the symptoms; Recommended follow up- PCP when necessary     Jacob Bale, MD 07/27/15 1545

## 2015-07-27 NOTE — ED Notes (Signed)
Pt remains in xray.

## 2015-07-27 NOTE — ED Notes (Signed)
Pt and wife given discharge instructions -- verbalized understanding, No co voiced - Pt wheeled off unit

## 2015-07-27 NOTE — ED Notes (Signed)
Pt back too room at this time

## 2015-08-26 DEATH — deceased

## 2016-03-11 IMAGING — DX DG RIBS W/ CHEST 3+V*R*
4 series · 4 of 4 positions shown · non-contrast
Comparison: None.

CLINICAL DATA: Status post slip and fall in the bathroom today with
a blow to the right side of the chest. Pain. Initial encounter.

EXAM:
RIGHT RIBS AND CHEST - 3+ VIEW

[chest pa]
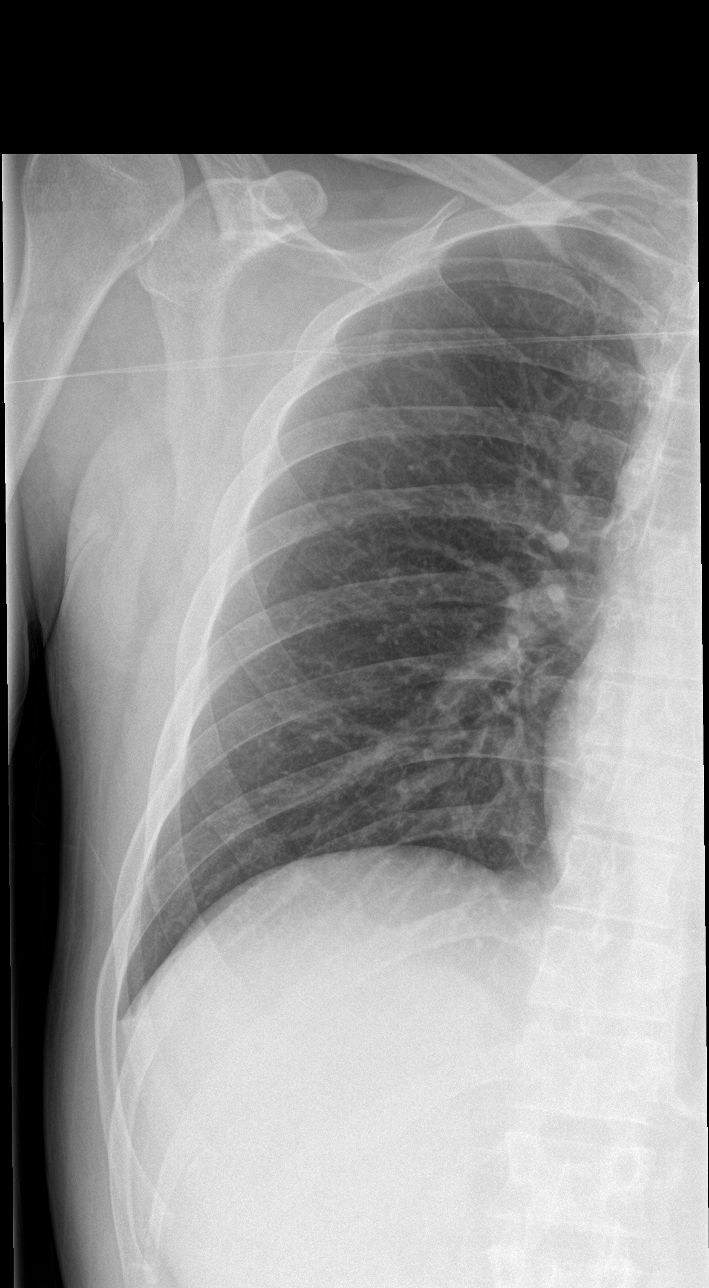

[rib pa obl (1 of 2)]
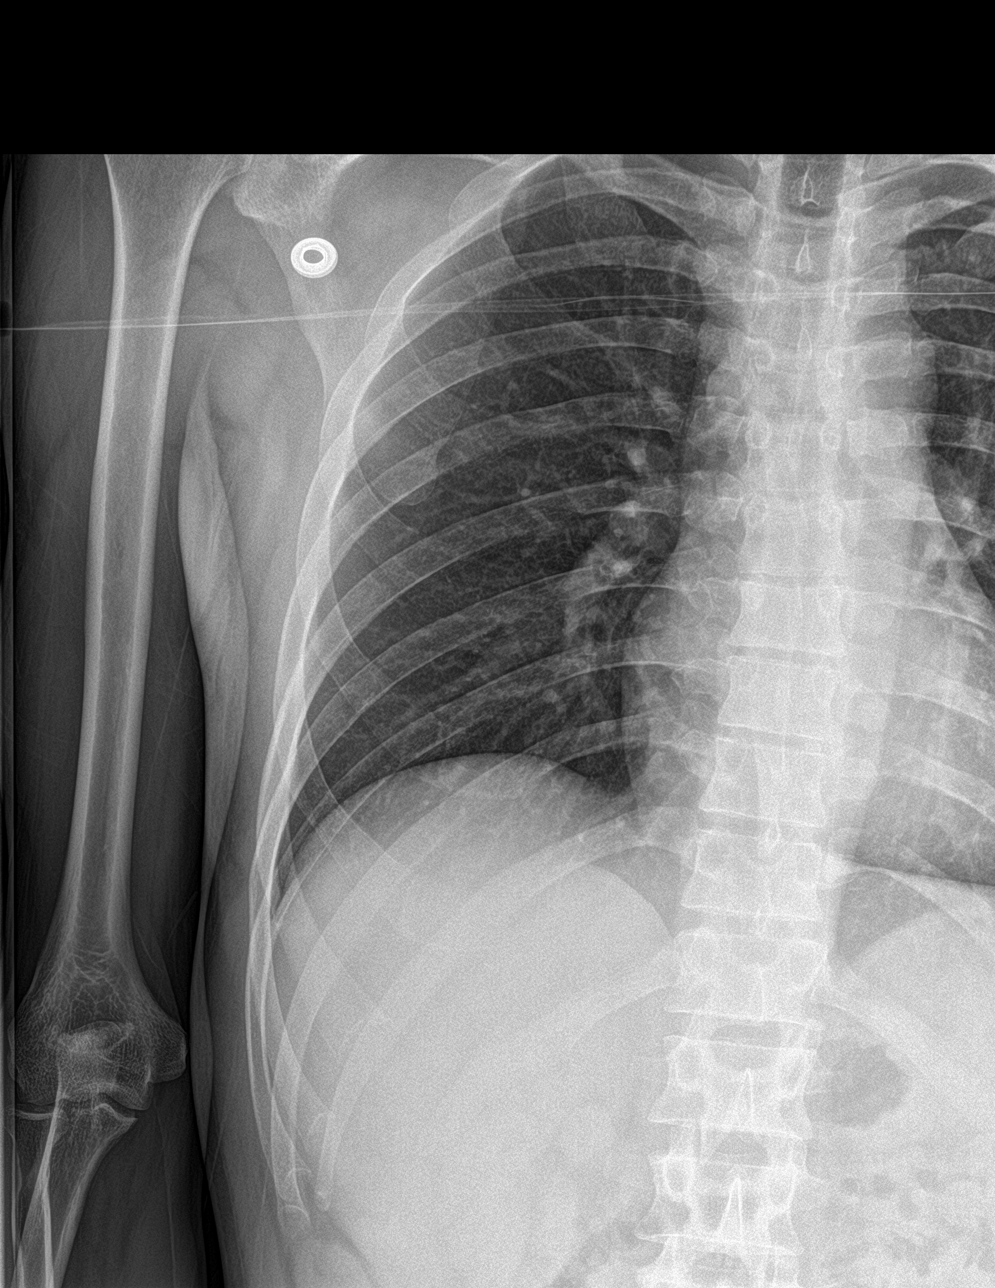

[rib pa obl (2 of 2)]
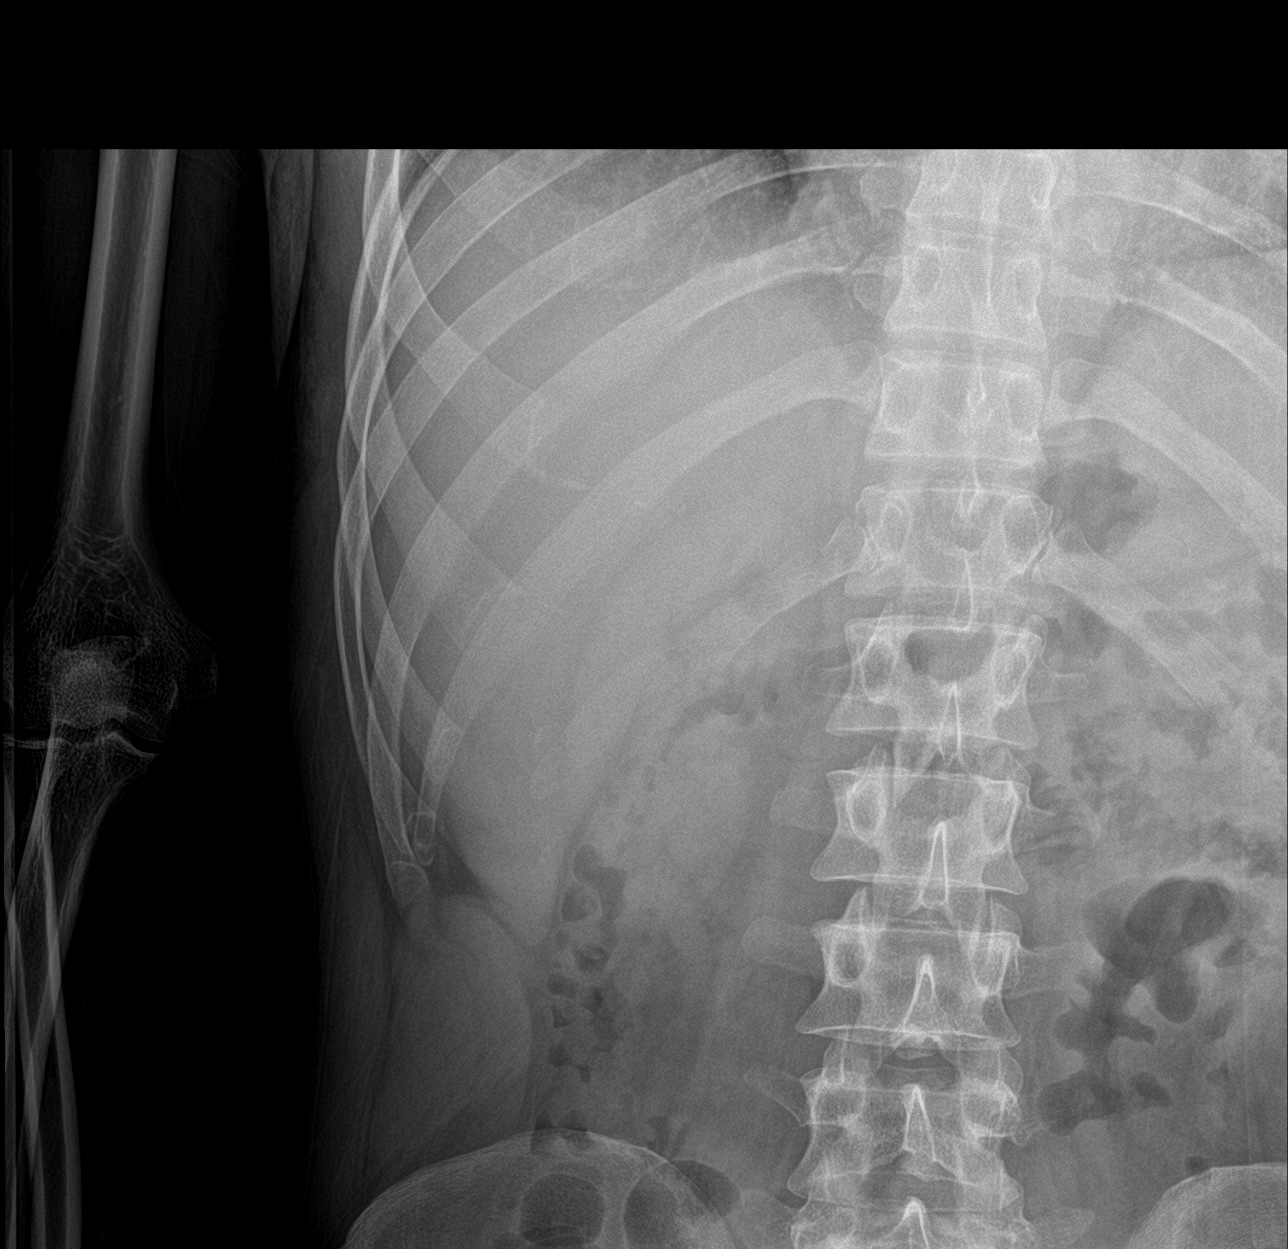

[chest ap]
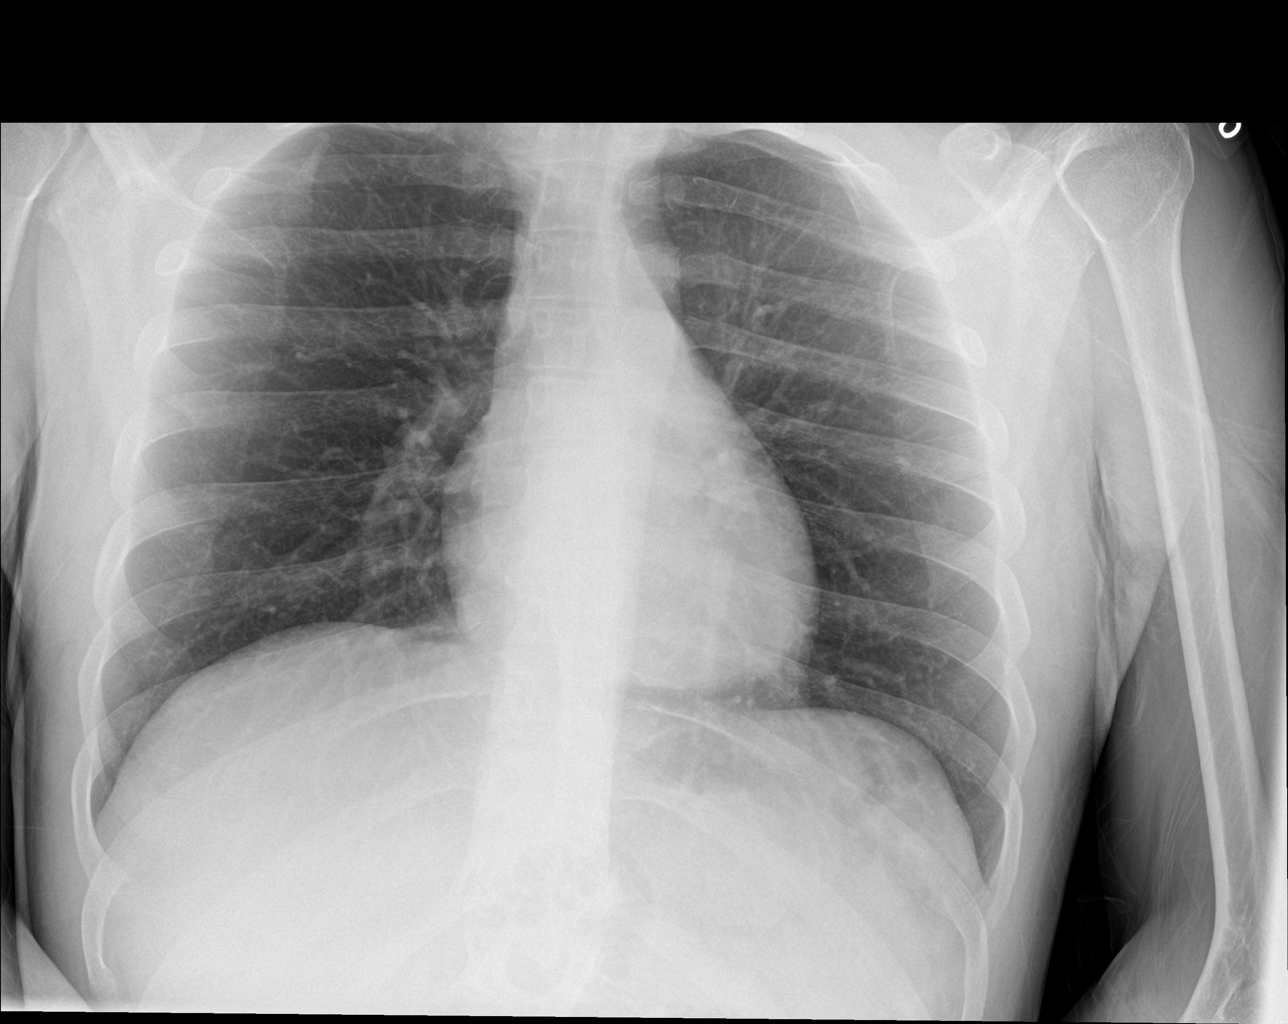

[4 of 4 positions shown; findings below may reference images not displayed]

FINDINGS: No rib fracture is identified. Surrounding osseous structures also
appear normal. The right lung is well expanded and clear.
IMPRESSION: Negative exam.
